# Patient Record
Sex: Female | Born: 1950 | Race: White | Hispanic: No | Smoking: Former smoker
Health system: Southern US, Community
[De-identification: ages and names within clinical notes are randomized; demographics above are authoritative.]

## PROBLEM LIST (undated history)

## (undated) DIAGNOSIS — F410 Panic disorder [episodic paroxysmal anxiety] without agoraphobia: Secondary | ICD-10-CM

## (undated) DIAGNOSIS — H409 Unspecified glaucoma: Secondary | ICD-10-CM

## (undated) DIAGNOSIS — E119 Type 2 diabetes mellitus without complications: Secondary | ICD-10-CM

## (undated) DIAGNOSIS — E785 Hyperlipidemia, unspecified: Secondary | ICD-10-CM

## (undated) DIAGNOSIS — C801 Malignant (primary) neoplasm, unspecified: Secondary | ICD-10-CM

## (undated) DIAGNOSIS — I10 Essential (primary) hypertension: Secondary | ICD-10-CM

## (undated) DIAGNOSIS — J189 Pneumonia, unspecified organism: Secondary | ICD-10-CM

## (undated) DIAGNOSIS — E039 Hypothyroidism, unspecified: Secondary | ICD-10-CM

## (undated) DIAGNOSIS — R0600 Dyspnea, unspecified: Secondary | ICD-10-CM

## (undated) HISTORY — PX: APPENDECTOMY: SHX54

## (undated) HISTORY — PX: RHINOPLASTY: SUR1284

## (undated) HISTORY — PX: EYE SURGERY: SHX253

## (undated) HISTORY — PX: DILATION AND CURETTAGE OF UTERUS: SHX78

## (undated) HISTORY — PX: ABDOMINAL HYSTERECTOMY: SHX81

## (undated) HISTORY — PX: CHOLECYSTECTOMY: SHX55

## (undated) HISTORY — PX: TONSILLECTOMY: SUR1361

---

## 2016-08-05 ENCOUNTER — Other Ambulatory Visit: Payer: Self-pay | Admitting: Family Medicine

## 2016-08-05 DIAGNOSIS — R921 Mammographic calcification found on diagnostic imaging of breast: Secondary | ICD-10-CM

## 2016-08-06 ENCOUNTER — Ambulatory Visit
Admission: RE | Admit: 2016-08-06 | Discharge: 2016-08-06 | Disposition: A | Discharge status: Home / Self Care | Payer: Medicare Other | Source: Ambulatory Visit | Attending: Family Medicine | Admitting: Family Medicine

## 2016-08-06 ENCOUNTER — Other Ambulatory Visit: Payer: Self-pay | Admitting: Family Medicine

## 2016-08-06 DIAGNOSIS — R921 Mammographic calcification found on diagnostic imaging of breast: Secondary | ICD-10-CM

## 2016-08-06 DIAGNOSIS — N6320 Unspecified lump in the left breast, unspecified quadrant: Secondary | ICD-10-CM

## 2016-08-16 ENCOUNTER — Other Ambulatory Visit: Payer: Self-pay | Admitting: Surgery

## 2016-08-16 DIAGNOSIS — N6320 Unspecified lump in the left breast, unspecified quadrant: Secondary | ICD-10-CM

## 2016-09-03 ENCOUNTER — Encounter (HOSPITAL_BASED_OUTPATIENT_CLINIC_OR_DEPARTMENT_OTHER): Payer: Self-pay | Admitting: *Deleted

## 2016-09-03 ENCOUNTER — Other Ambulatory Visit: Payer: Self-pay | Admitting: Surgery

## 2016-09-03 DIAGNOSIS — N6320 Unspecified lump in the left breast, unspecified quadrant: Secondary | ICD-10-CM

## 2016-09-07 ENCOUNTER — Encounter (HOSPITAL_BASED_OUTPATIENT_CLINIC_OR_DEPARTMENT_OTHER)
Admission: RE | Admit: 2016-09-07 | Discharge: 2016-09-07 | Disposition: A | Discharge status: Home / Self Care | Payer: Medicare Other | Source: Ambulatory Visit | Attending: Surgery | Admitting: Surgery

## 2016-09-07 DIAGNOSIS — N6320 Unspecified lump in the left breast, unspecified quadrant: Secondary | ICD-10-CM | POA: Diagnosis present

## 2016-09-07 DIAGNOSIS — I10 Essential (primary) hypertension: Secondary | ICD-10-CM | POA: Diagnosis not present

## 2016-09-07 DIAGNOSIS — Z79899 Other long term (current) drug therapy: Secondary | ICD-10-CM | POA: Diagnosis not present

## 2016-09-07 DIAGNOSIS — Z803 Family history of malignant neoplasm of breast: Secondary | ICD-10-CM | POA: Diagnosis not present

## 2016-09-07 DIAGNOSIS — Z7984 Long term (current) use of oral hypoglycemic drugs: Secondary | ICD-10-CM | POA: Diagnosis not present

## 2016-09-07 DIAGNOSIS — Z87891 Personal history of nicotine dependence: Secondary | ICD-10-CM | POA: Diagnosis not present

## 2016-09-07 DIAGNOSIS — Z6833 Body mass index (BMI) 33.0-33.9, adult: Secondary | ICD-10-CM | POA: Diagnosis not present

## 2016-09-07 DIAGNOSIS — D0512 Intraductal carcinoma in situ of left breast: Secondary | ICD-10-CM | POA: Diagnosis not present

## 2016-09-07 DIAGNOSIS — E119 Type 2 diabetes mellitus without complications: Secondary | ICD-10-CM | POA: Diagnosis not present

## 2016-09-07 LAB — BASIC METABOLIC PANEL
ANION GAP: 10 (ref 5–15)
BUN: 8 mg/dL (ref 6–20)
CALCIUM: 9.6 mg/dL (ref 8.9–10.3)
CO2: 23 mmol/L (ref 22–32)
CREATININE: 0.73 mg/dL (ref 0.44–1.00)
Chloride: 103 mmol/L (ref 101–111)
GFR calc Af Amer: >60 mL/min (ref >60)
GLUCOSE: 202 mg/dL — AB (ref 65–99)
Potassium: 4.4 mmol/L (ref 3.5–5.1)
Sodium: 136 mmol/L (ref 135–145)

## 2016-09-07 NOTE — Progress Notes (Signed)
Bottled water given with instructions to complete by [facility], pt verbalized understanding.

## 2016-09-08 ENCOUNTER — Ambulatory Visit
Admission: RE | Admit: 2016-09-08 | Discharge: 2016-09-08 | Disposition: A | Discharge status: Home / Self Care | Payer: Medicare Other | Source: Ambulatory Visit | Attending: Surgery | Admitting: Surgery

## 2016-09-08 DIAGNOSIS — N6320 Unspecified lump in the left breast, unspecified quadrant: Secondary | ICD-10-CM

## 2016-09-08 NOTE — H&P (Signed)
Holly [name] 08/16/2016 10:13 AM Location: [facility] Patient #: [id number] DOB: 1951/04/11 Divorced / Language: Cleophus Molt / Race: White Female   History of Present Illness (Yuvan Medinger A. Ninfa Linden MD; 08/16/2016 10:34 AM) Patient words: New-Breast CA.  The patient is a 66 year old female who presents with a breast mass. This is a pleasant female who is referred by the breast center. She had a recent screening mammography and 3 suspicious areas were seen in the breast. All 3 areas were biopsied and found to be benign fibrocystic changes and a fibroadenoma but one area in the lateral breast was felt to be discordant. A lumpectomy as been recommended. She has had no previous surgery regarding her breast. Her mother had breast cancer age 66. She denies nipple discharge. She is otherwise without complaints.   Past Surgical History Malachi Bonds, CMA; 08/16/2016 10:14 AM) Appendectomy  Breast Biopsy  Left. Cataract Surgery  Left. Cesarean Section - 1  Gallbladder Surgery - Open  Hysterectomy (not due to cancer) - Partial  Tonsillectomy   Diagnostic Studies History Malachi Bonds, CMA; 08/16/2016 10:14 AM) Colonoscopy  never Mammogram  within last year Pap Smear  >5 years ago  Allergies Malachi Bonds, CMA; 08/16/2016 10:15 AM) No Known Drug Allergies 08/16/2016  Medication History (Chemira Jones, CMA; 08/16/2016 10:16 AM) Dorzolamide HCl (2% Solution, Ophthalmic) Active. Latanoprost (0.005% Solution, Ophthalmic) Active. Timolol Maleate (0.5% Solution, Ophthalmic) Active. Simvastatin (10MG  Tablet, Oral) Active. MetFORMIN HCl (500MG  Tablet, Oral) Active. Levothyroxine Sodium (100MCG Tablet, Oral) Active. Lisinopril (10MG  Tablet, Oral) Active. Medications Reconciled  Social History Malachi Bonds, CMA; 08/16/2016 10:14 AM) Alcohol use  Occasional alcohol use. Caffeine use  Carbonated beverages, Coffee, Tea. No drug use  Tobacco use  Former  smoker.  Family History Malachi Bonds, CMA; 08/16/2016 10:14 AM) Arthritis  Mother. Breast Cancer  Mother. Cerebrovascular Accident  Mother, Sister. Heart Disease  Father. Respiratory Condition  Father, Mother, Sister. Thyroid problems  Mother, Sister.  Pregnancy / Birth History Malachi Bonds, CMA; 08/16/2016 10:14 AM) Age at menarche  24 years. Contraceptive History  Oral contraceptives. Gravida  2 Length (months) of breastfeeding  7-12 Maternal age  6-25 Para  2  Other Problems Malachi Bonds, CMA; 08/16/2016 10:14 AM) Cholelithiasis  Diabetes Mellitus  High blood pressure  Thyroid Disease     Review of Systems (Chemira Jones CMA; 08/16/2016 10:14 AM) General Not Present- Appetite Loss, Chills, Fatigue, Fever, Night Sweats, Weight Gain and Weight Loss. Skin Not Present- Change in Wart/Mole, Dryness, Hives, Jaundice, New Lesions, Non-Healing Wounds, Rash and Ulcer. HEENT Not Present- Earache, Hearing Loss, Hoarseness, Nose Bleed, Oral Ulcers, Ringing in the Ears, Seasonal Allergies, Sinus Pain, Sore Throat, Visual Disturbances, Wears glasses/contact lenses and Yellow Eyes. Breast Not Present- Breast Mass, Breast Pain, Nipple Discharge and Skin Changes. Cardiovascular Not Present- Chest Pain, Difficulty Breathing Lying Down, Leg Cramps, Palpitations, Rapid Heart Rate, Shortness of Breath and Swelling of Extremities. Gastrointestinal Not Present- Abdominal Pain, Bloating, Bloody Stool, Change in Bowel Habits, Chronic diarrhea, Constipation, Difficulty Swallowing, Excessive gas, Gets full quickly at meals, Hemorrhoids, Indigestion, Nausea, Rectal Pain and Vomiting. Female Genitourinary Not Present- Frequency, Nocturia, Painful Urination, Pelvic Pain and Urgency. Musculoskeletal Not Present- Back Pain, Joint Pain, Joint Stiffness, Muscle Pain, Muscle Weakness and Swelling of Extremities. Neurological Not Present- Decreased Memory, Fainting, Headaches, Numbness,  Seizures, Tingling, Tremor, Trouble walking and Weakness. Psychiatric Not Present- Anxiety, Bipolar, Change in Sleep Pattern, Depression, Fearful and Frequent crying. Endocrine Not Present- Cold Intolerance, Excessive Hunger, Hair Changes, Heat  Intolerance, Hot flashes and New Diabetes. Hematology Not Present- Blood Thinners, Easy Bruising, Excessive bleeding, Gland problems, HIV and Persistent Infections.  Vitals (Chemira Jones CMA; 08/16/2016 10:14 AM) 08/16/2016 10:14 AM Weight: 197.4 lb Height: 64in Body Surface Area: 1.95 m Body Mass Index: 33.88 kg/m  Temp.: 98.55F(Oral)  Pulse: 73 (Regular)  BP: 150/86 (Sitting, Left Arm, Standard)       Physical Exam (Geeta Dworkin A. Ninfa Linden MD; 08/16/2016 10:34 AM) General Mental Status-Alert. General Appearance-Consistent with stated age. Hydration-Well hydrated. Voice-Normal.  Head and Neck Head-normocephalic, atraumatic with no lesions or palpable masses. Trachea-midline. Thyroid Gland Characteristics - normal size and consistency.  Eye Eyeball - Bilateral-Extraocular movements intact. Sclera/Conjunctiva - Bilateral-No scleral icterus.  Chest and Lung Exam Chest and lung exam reveals -quiet, even and easy respiratory effort with no use of accessory muscles and on auscultation, normal breath sounds, no adventitious sounds and normal vocal resonance. Inspection Chest Wall - Normal. Back - normal.  Breast Breast - Left-Non Tender, No Biopsy scars, no Dimpling, No Inflammation, No Lumpectomy scars, No Mastectomy scars, No Peau d' Orange. Note: There is ecchymosis of the left breast. I can palpate several hematomas in the breast as well. There are no specific masses. The nipple areolar complex is normal. There is no adenopathy Breast - Right-Symmetric, Non Tender, No Biopsy scars, no Dimpling, No Inflammation, No Lumpectomy scars, No Mastectomy scars, No Peau d' Orange.  Cardiovascular Cardiovascular  examination reveals -normal heart sounds, regular rate and rhythm with no murmurs and normal pedal pulses bilaterally.  Abdomen Inspection Inspection of the abdomen reveals - No Hernias. Skin - Scar - no surgical scars. Palpation/Percussion Palpation and Percussion of the abdomen reveal - Soft, Non Tender, No Rebound tenderness, No Rigidity (guarding) and No hepatosplenomegaly. Auscultation Auscultation of the abdomen reveals - Bowel sounds normal.  Neurologic - Did not examine.  Musculoskeletal - Did not examine.  Lymphatic Head & Neck  General Head & Neck Lymphatics: Bilateral - Description - Normal. Axillary  General Axillary Region: Bilateral - Description - Normal. Tenderness - Non Tender. Femoral & Inguinal  Generalized Femoral & Inguinal Lymphatics: Bilateral - Description - Normal. Tenderness - Non Tender.    Assessment & Plan (Zykeem Bauserman A. Ninfa Linden MD; 08/16/2016 10:35 AM) LEFT BREAST MASS (N63.20) Impression: I have reviewed the mammograms, ultrasound, and pathology results. Again, one area in the left lateral breast is discordant. A radioactive seed localized left breast lumpectomy is recommended for histologic evaluation to rule out malignancy. I discussed procedure with her in detail. I discussed risks of surgery which includes but is not limited to bleeding, infection, need for further surgery malignancy is found, postoperative recovery, etc. She understands and wished to proceed with surgery which will be scheduled

## 2016-09-09 ENCOUNTER — Ambulatory Visit (HOSPITAL_BASED_OUTPATIENT_CLINIC_OR_DEPARTMENT_OTHER)
Admission: RE | Admit: 2016-09-09 | Discharge: 2016-09-09 | Disposition: A | Discharge status: Home / Self Care | Payer: Medicare Other | Source: Ambulatory Visit | Attending: Surgery | Admitting: Surgery

## 2016-09-09 ENCOUNTER — Ambulatory Visit (HOSPITAL_BASED_OUTPATIENT_CLINIC_OR_DEPARTMENT_OTHER): Payer: Medicare Other | Admitting: Anesthesiology

## 2016-09-09 ENCOUNTER — Ambulatory Visit
Admission: RE | Admit: 2016-09-09 | Discharge: 2016-09-09 | Disposition: A | Discharge status: Home / Self Care | Payer: Medicare Other | Source: Ambulatory Visit | Attending: Surgery | Admitting: Surgery

## 2016-09-09 ENCOUNTER — Encounter (HOSPITAL_BASED_OUTPATIENT_CLINIC_OR_DEPARTMENT_OTHER): Payer: Self-pay | Admitting: Anesthesiology

## 2016-09-09 ENCOUNTER — Encounter (HOSPITAL_BASED_OUTPATIENT_CLINIC_OR_DEPARTMENT_OTHER)
Admission: RE | Disposition: A | Discharge status: Home / Self Care | Payer: Self-pay | Source: Ambulatory Visit | Attending: Surgery

## 2016-09-09 DIAGNOSIS — Z79899 Other long term (current) drug therapy: Secondary | ICD-10-CM | POA: Diagnosis not present

## 2016-09-09 DIAGNOSIS — D0512 Intraductal carcinoma in situ of left breast: Secondary | ICD-10-CM | POA: Insufficient documentation

## 2016-09-09 DIAGNOSIS — Z7984 Long term (current) use of oral hypoglycemic drugs: Secondary | ICD-10-CM | POA: Insufficient documentation

## 2016-09-09 DIAGNOSIS — Z6833 Body mass index (BMI) 33.0-33.9, adult: Secondary | ICD-10-CM | POA: Insufficient documentation

## 2016-09-09 DIAGNOSIS — Z803 Family history of malignant neoplasm of breast: Secondary | ICD-10-CM | POA: Insufficient documentation

## 2016-09-09 DIAGNOSIS — N6320 Unspecified lump in the left breast, unspecified quadrant: Secondary | ICD-10-CM

## 2016-09-09 DIAGNOSIS — Z87891 Personal history of nicotine dependence: Secondary | ICD-10-CM | POA: Insufficient documentation

## 2016-09-09 DIAGNOSIS — I10 Essential (primary) hypertension: Secondary | ICD-10-CM | POA: Insufficient documentation

## 2016-09-09 DIAGNOSIS — E119 Type 2 diabetes mellitus without complications: Secondary | ICD-10-CM | POA: Insufficient documentation

## 2016-09-09 HISTORY — PX: BREAST LUMPECTOMY WITH RADIOACTIVE SEED LOCALIZATION: SHX6424

## 2016-09-09 HISTORY — DX: Essential (primary) hypertension: I10

## 2016-09-09 HISTORY — DX: Unspecified glaucoma: H40.9

## 2016-09-09 HISTORY — DX: Hyperlipidemia, unspecified: E78.5

## 2016-09-09 HISTORY — DX: Type 2 diabetes mellitus without complications: E11.9

## 2016-09-09 HISTORY — DX: Panic disorder (episodic paroxysmal anxiety): F41.0

## 2016-09-09 LAB — GLUCOSE, CAPILLARY
Glucose-Capillary: 212 mg/dL — ABNORMAL HIGH (ref 65–99)
Glucose-Capillary: 191 mg/dL — ABNORMAL HIGH (ref 65–99)

## 2016-09-09 SURGERY — BREAST LUMPECTOMY WITH RADIOACTIVE SEED LOCALIZATION
Anesthesia: General | Site: Breast | Laterality: Left

## 2016-09-09 MED ORDER — PHENYLEPHRINE 40 MCG/ML (10ML) SYRINGE FOR IV PUSH (FOR BLOOD PRESSURE SUPPORT)
PREFILLED_SYRINGE | INTRAVENOUS | Status: AC
  Filled 2016-09-09: qty 10

## 2016-09-09 MED ORDER — CHLORHEXIDINE GLUCONATE CLOTH 2 % EX PADS: 6.0000 | MEDICATED_PAD | Freq: Once | CUTANEOUS | Status: DC

## 2016-09-09 MED ORDER — DEXAMETHASONE SODIUM PHOSPHATE 10 MG/ML IJ SOLN
INTRAMUSCULAR | Status: AC
  Filled 2016-09-09: qty 1

## 2016-09-09 MED ORDER — SODIUM CHLORIDE 0.9% FLUSH: 3.0000 mL | INTRAVENOUS | Status: DC | PRN

## 2016-09-09 MED ORDER — SUCCINYLCHOLINE CHLORIDE 200 MG/10ML IV SOSY
PREFILLED_SYRINGE | INTRAVENOUS | Status: AC
  Filled 2016-09-09: qty 10

## 2016-09-09 MED ORDER — BUPIVACAINE-EPINEPHRINE 0.5% -1:200000 IJ SOLN
INTRAMUSCULAR | Status: DC | PRN
  Administered 2016-09-09: 12 mL

## 2016-09-09 MED ORDER — ACETAMINOPHEN 500 MG PO TABS
1000.0000 mg | ORAL_TABLET | ORAL | Status: AC
  Administered 2016-09-09: 1000 mg via ORAL

## 2016-09-09 MED ORDER — KETOROLAC TROMETHAMINE 15 MG/ML IJ SOLN
INTRAMUSCULAR | Status: DC | PRN
  Administered 2016-09-09: 15 mg via INTRAVENOUS

## 2016-09-09 MED ORDER — FENTANYL CITRATE (PF) 100 MCG/2ML IJ SOLN
50.0000 ug | INTRAMUSCULAR | Status: DC | PRN
  Administered 2016-09-09: 50 ug via INTRAVENOUS

## 2016-09-09 MED ORDER — LIDOCAINE 2% (20 MG/ML) 5 ML SYRINGE
INTRAMUSCULAR | Status: AC
  Filled 2016-09-09: qty 5

## 2016-09-09 MED ORDER — ACETAMINOPHEN 325 MG PO TABS: 650.0000 mg | ORAL_TABLET | ORAL | Status: DC | PRN

## 2016-09-09 MED ORDER — ONDANSETRON HCL 4 MG/2ML IJ SOLN
INTRAMUSCULAR | Status: DC | PRN
Start: 2016-09-09 — End: 2016-09-09
  Administered 2016-09-09: 4 mg via INTRAVENOUS

## 2016-09-09 MED ORDER — FENTANYL CITRATE (PF) 100 MCG/2ML IJ SOLN
INTRAMUSCULAR | Status: AC
  Filled 2016-09-09: qty 2

## 2016-09-09 MED ORDER — ONDANSETRON HCL 4 MG/2ML IJ SOLN
INTRAMUSCULAR | Status: AC
  Filled 2016-09-09: qty 2

## 2016-09-09 MED ORDER — SCOPOLAMINE 1 MG/3DAYS TD PT72: 1.0000 | MEDICATED_PATCH | Freq: Once | TRANSDERMAL | Status: DC | PRN

## 2016-09-09 MED ORDER — MIDAZOLAM HCL 2 MG/2ML IJ SOLN: 1.0000 mg | INTRAMUSCULAR | Status: DC | PRN

## 2016-09-09 MED ORDER — OXYCODONE HCL 5 MG PO TABS: 5.0000 mg | ORAL_TABLET | Freq: Once | ORAL | Status: DC | PRN

## 2016-09-09 MED ORDER — FENTANYL CITRATE (PF) 100 MCG/2ML IJ SOLN: 25.0000 ug | INTRAMUSCULAR | Status: DC | PRN

## 2016-09-09 MED ORDER — SODIUM CHLORIDE 0.9% FLUSH: 3.0000 mL | Freq: Two times a day (BID) | INTRAVENOUS | Status: DC

## 2016-09-09 MED ORDER — MIDAZOLAM HCL 2 MG/2ML IJ SOLN
INTRAMUSCULAR | Status: AC
  Filled 2016-09-09: qty 2

## 2016-09-09 MED ORDER — CEFAZOLIN SODIUM-DEXTROSE 2-4 GM/100ML-% IV SOLN
INTRAVENOUS | Status: AC
  Filled 2016-09-09: qty 100

## 2016-09-09 MED ORDER — KETOROLAC TROMETHAMINE 30 MG/ML IJ SOLN
INTRAMUSCULAR | Status: AC
  Filled 2016-09-09: qty 1

## 2016-09-09 MED ORDER — EPHEDRINE 5 MG/ML INJ
INTRAVENOUS | Status: AC
  Filled 2016-09-09: qty 10

## 2016-09-09 MED ORDER — OXYCODONE HCL 5 MG/5ML PO SOLN: 5.0000 mg | Freq: Once | ORAL | Status: DC | PRN

## 2016-09-09 MED ORDER — OXYCODONE HCL 5 MG PO TABS: 5.0000 mg | ORAL_TABLET | ORAL | Status: DC | PRN

## 2016-09-09 MED ORDER — LACTATED RINGERS IV SOLN
INTRAVENOUS | Status: DC
  Administered 2016-09-09: 09:00:00 via INTRAVENOUS

## 2016-09-09 MED ORDER — BUPIVACAINE-EPINEPHRINE (PF) 0.5% -1:200000 IJ SOLN
INTRAMUSCULAR | Status: AC
  Filled 2016-09-09: qty 30

## 2016-09-09 MED ORDER — SODIUM CHLORIDE 0.9 % IV SOLN: 250.0000 mL | INTRAVENOUS | Status: DC | PRN

## 2016-09-09 MED ORDER — ACETAMINOPHEN 500 MG PO TABS
ORAL_TABLET | ORAL | Status: AC
  Filled 2016-09-09: qty 2

## 2016-09-09 MED ORDER — LIDOCAINE HCL (CARDIAC) 20 MG/ML IV SOLN
INTRAVENOUS | Status: DC | PRN
  Administered 2016-09-09: 60 mg via INTRAVENOUS

## 2016-09-09 MED ORDER — PROPOFOL 10 MG/ML IV BOLUS
INTRAVENOUS | Status: DC | PRN
  Administered 2016-09-09: 150 mg via INTRAVENOUS

## 2016-09-09 MED ORDER — TRAMADOL HCL 50 MG PO TABS: 50.0000 mg | ORAL_TABLET | Freq: Four times a day (QID) | ORAL | 0 refills | Status: DC | PRN

## 2016-09-09 MED ORDER — MORPHINE SULFATE (PF) 2 MG/ML IV SOLN: 1.0000 mg | INTRAVENOUS | Status: DC | PRN

## 2016-09-09 MED ORDER — CEFAZOLIN SODIUM-DEXTROSE 2-4 GM/100ML-% IV SOLN
2.0000 g | INTRAVENOUS | Status: AC
  Administered 2016-09-09: 2 g via INTRAVENOUS

## 2016-09-09 MED ORDER — DEXAMETHASONE SODIUM PHOSPHATE 4 MG/ML IJ SOLN
INTRAMUSCULAR | Status: DC | PRN
  Administered 2016-09-09: 8 mg via INTRAVENOUS

## 2016-09-09 MED ORDER — ACETAMINOPHEN 650 MG RE SUPP: 650.0000 mg | RECTAL | Status: DC | PRN

## 2016-09-09 SURGICAL SUPPLY — 46 items
APPLIER CLIP 9.375 MED OPEN (MISCELLANEOUS) ×3
BLADE HEX COATED 2.75 (ELECTRODE) ×3 IMPLANT
BLADE SURG 15 STRL LF DISP TIS (BLADE) ×1 IMPLANT
BLADE SURG 15 STRL SS (BLADE) ×2
CANISTER SUC SOCK COL 7IN (MISCELLANEOUS) IMPLANT
CANISTER SUCT 1200ML W/VALVE (MISCELLANEOUS) IMPLANT
CHLORAPREP W/TINT 26ML (MISCELLANEOUS) ×3 IMPLANT
CLIP APPLIE 9.375 MED OPEN (MISCELLANEOUS) ×1 IMPLANT
CLIP TI WIDE RED SMALL 6 (CLIP) IMPLANT
COVER BACK TABLE 60X90IN (DRAPES) ×3 IMPLANT
COVER MAYO STAND STRL (DRAPES) ×3 IMPLANT
COVER PROBE W GEL 5X96 (DRAPES) ×3 IMPLANT
DECANTER SPIKE VIAL GLASS SM (MISCELLANEOUS) IMPLANT
DERMABOND ADVANCED (GAUZE/BANDAGES/DRESSINGS) ×2
DERMABOND ADVANCED .7 DNX12 (GAUZE/BANDAGES/DRESSINGS) ×1 IMPLANT
DEVICE DUBIN W/COMP PLATE 8390 (MISCELLANEOUS) ×3 IMPLANT
DRAPE LAPAROSCOPIC ABDOMINAL (DRAPES) ×3 IMPLANT
DRAPE UTILITY XL STRL (DRAPES) ×3 IMPLANT
ELECT REM PT RETURN 9FT ADLT (ELECTROSURGICAL) ×3
ELECTRODE REM PT RTRN 9FT ADLT (ELECTROSURGICAL) ×1 IMPLANT
GAUZE SPONGE 4X4 12PLY STRL LF (GAUZE/BANDAGES/DRESSINGS) IMPLANT
GLOVE BIO SURGEON STRL SZ7 (GLOVE) ×3 IMPLANT
GLOVE BIOGEL PI IND STRL 7.5 (GLOVE) ×1 IMPLANT
GLOVE BIOGEL PI INDICATOR 7.5 (GLOVE) ×2
GLOVE SURG SIGNA 7.5 PF LTX (GLOVE) ×3 IMPLANT
GOWN STRL REUS W/ TWL LRG LVL3 (GOWN DISPOSABLE) ×1 IMPLANT
GOWN STRL REUS W/ TWL XL LVL3 (GOWN DISPOSABLE) ×1 IMPLANT
GOWN STRL REUS W/TWL LRG LVL3 (GOWN DISPOSABLE) ×2
GOWN STRL REUS W/TWL XL LVL3 (GOWN DISPOSABLE) ×2
KIT MARKER MARGIN INK (KITS) ×3 IMPLANT
NEEDLE HYPO 25X1 1.5 SAFETY (NEEDLE) ×3 IMPLANT
NS IRRIG 1000ML POUR BTL (IV SOLUTION) IMPLANT
PACK BASIN DAY SURGERY FS (CUSTOM PROCEDURE TRAY) ×3 IMPLANT
PENCIL BUTTON HOLSTER BLD 10FT (ELECTRODE) ×3 IMPLANT
SLEEVE SCD COMPRESS KNEE MED (MISCELLANEOUS) ×3 IMPLANT
SPONGE LAP 4X18 X RAY DECT (DISPOSABLE) ×3 IMPLANT
SUT MNCRL AB 4-0 PS2 18 (SUTURE) ×3 IMPLANT
SUT SILK 2 0 SH (SUTURE) IMPLANT
SUT VIC AB 3-0 SH 27 (SUTURE) ×2
SUT VIC AB 3-0 SH 27X BRD (SUTURE) ×1 IMPLANT
SYR CONTROL 10ML LL (SYRINGE) ×3 IMPLANT
TOWEL OR 17X24 6PK STRL BLUE (TOWEL DISPOSABLE) ×3 IMPLANT
TOWEL OR NON WOVEN STRL DISP B (DISPOSABLE) ×3 IMPLANT
TUBE CONNECTING 20'X1/4 (TUBING)
TUBE CONNECTING 20X1/4 (TUBING) IMPLANT
YANKAUER SUCT BULB TIP NO VENT (SUCTIONS) IMPLANT

## 2016-09-09 NOTE — Transfer of Care (Signed)
Immediate Anesthesia Transfer of Care Note  Patient: Libby Maw  Procedure(s) Performed: Procedure(s): LEFT BREAST LUMPECTOMY WITH RADIOACTIVE SEED LOCALIZATION (Left)  Patient Location: PACU  Anesthesia Type:General  Level of Consciousness: awake, alert  and drowsy  Airway & Oxygen Therapy: Patient Spontanous Breathing and Patient connected to face mask oxygen  Post-op Assessment: Report given to RN and Post -op Vital signs reviewed and stable  Post vital signs: Reviewed and stable  Last Vitals:  Vitals:   09/09/16 0910  BP: 127/72  Pulse: 77  Resp: 18  Temp: 36.6 C    Last Pain:  Vitals:   09/09/16 0910  TempSrc: Oral      Patients Stated Pain Goal: 0 ([id number])  Complications: No apparent anesthesia complications

## 2016-09-09 NOTE — Anesthesia Preprocedure Evaluation (Signed)
Anesthesia Evaluation  Patient identified by MRN, date of birth, ID band Patient awake    Reviewed: Allergy & Precautions, NPO status , Patient's Chart, lab work & pertinent test results  History of Anesthesia Complications Negative for: history of anesthetic complications  Airway Mallampati: II  TM Distance: >3 FB Neck ROM: Full    Dental  (+) Teeth Intact,    Pulmonary neg shortness of breath, neg sleep apnea, neg COPD, neg recent URI, former smoker,    breath sounds clear to auscultation       Cardiovascular hypertension, Pt. on medications (-) angina(-) Past MI and (-) CHF  Rhythm:Regular     Neuro/Psych PSYCHIATRIC DISORDERS Anxiety negative neurological ROS     GI/Hepatic negative GI ROS, Neg liver ROS,   Endo/Other  diabetes, Type 2, Oral Hypoglycemic AgentsMorbid obesity  Renal/GU negative Renal ROS     Musculoskeletal   Abdominal   Peds  Hematology   Anesthesia Other Findings   Reproductive/Obstetrics                             Anesthesia Physical Anesthesia Plan  ASA: III  Anesthesia Plan: General   Post-op Pain Management:    Induction: Intravenous  Airway Management Planned: LMA  Additional Equipment: None  Intra-op Plan:   Post-operative Plan: Extubation in OR  Informed Consent: I have reviewed the patients History and Physical, chart, labs and discussed the procedure including the risks, benefits and alternatives for the proposed anesthesia with the patient or authorized representative who has indicated his/her understanding and acceptance.   Dental advisory given  Plan Discussed with: CRNA and Surgeon  Anesthesia Plan Comments:         Anesthesia Quick Evaluation

## 2016-09-09 NOTE — Op Note (Signed)
LEFT BREAST LUMPECTOMY WITH RADIOACTIVE SEED LOCALIZATION  Procedure Note  Holly [name] 09/09/2016   Pre-op Diagnosis: left breast mass     Post-op Diagnosis: same  Procedure(s): LEFT BREAST LUMPECTOMY WITH RADIOACTIVE SEED LOCALIZATION  Surgeon(s): Coralie Keens, MD  Anesthesia: General  Staff:  Circulator: Ted Mcalpine, RN; Cliffton Asters, RN Scrub Person: Maurene Capes, RN  Estimated Blood Loss: Minimal               Specimens: sent to path  Indications: This is [name] 66 year old female who is status post stereotactic guided biopsy of 3 areas in her left breast. All 3 biopsies were benign but one area was felt to be discordant with the mammographic findings therefore [name] radioactive seed localized left breast lumpectomy of this area was recommended.  Findings: There was [name] palpable mass in the specimen. Under x-ray, the radioactive seed and previous marker were in the center of the specimen which was then sent to pathology for evaluation  Procedure: The patient was brought to the operating room and identified as the correct patient. She was placed supine on the operating table and general anesthesia was induced. Her left breast was then prepped and draped in usual sterile fashion. The cecum was located with the neoprobe in the lower outer quadrant. I anesthetized the skin along the inframammary ridge and made an incision with the scalpel. I then tunneled up to the radiated seed with the aid of neoprobe and the electrocautery. I couldn't 5 palpable mass which is at the area of the seed. I performed [name] wide lumpectomy with electrocautery staying widely around the radioactive seed and mass. Once the mass was removed and marked on margins marker pain. I confirmed with the seed was in the specimen with the neoprobe and then the specimen was x-rayed again confirming that the seed improve his marker were in place. The specimen was sent to pathology for evaluation. I achieved  hemostasis with the cautery. I anesthetized wound further with Marcaine. I placed surgical clips into the biopsy cavity. I then closed the subcutaneous tissue with interrupted 3-0 Vicryl sutures and closed the skin with [name] running 4-0 Monocryl. Dermabond was then applied. The patient tolerated the procedure well. All the counts were correct at the end of the procedure. She was then extubated in the operating room and taken in [name] stable condition to the recovery room.          Holly [name]   Date: 09/09/2016  Time: 10:37 AM

## 2016-09-09 NOTE — Discharge Instructions (Signed)
[facility] °Office Phone Number [phone] ° °BREAST BIOPSY/ PARTIAL MASTECTOMY: POST OP INSTRUCTIONS ° °Always review your discharge instruction sheet given to you by the facility where your surgery was performed. ° °IF YOU HAVE DISABILITY OR FAMILY LEAVE FORMS, YOU MUST BRING THEM TO THE OFFICE FOR PROCESSING.  DO NOT GIVE THEM TO YOUR DOCTOR. ° °1. A prescription for pain medication may be given to you upon discharge.  Take your pain medication as prescribed, if needed.  If narcotic pain medicine is not needed, then you may take acetaminophen (Tylenol) or ibuprofen (Advil) as needed. °2. Take your usually prescribed medications unless otherwise directed °3. If you need a refill on your pain medication, please contact your pharmacy.  They will contact our office to request authorization.  Prescriptions will not be filled after 5pm or on week-ends. °4. You should eat very light the first 24 hours after surgery, such as soup, crackers, pudding, etc.  Resume your normal diet the day after surgery. °5. Most patients will experience some swelling and bruising in the breast.  Ice packs and a good support bra will help.  Swelling and bruising can take several days to resolve.  °6. It is common to experience some constipation if taking pain medication after surgery.  Increasing fluid intake and taking a stool softener will usually help or prevent this problem from occurring.  A mild laxative (Milk of Magnesia or Miralax) should be taken according to package directions if there are no bowel movements after 48 hours. °7. Unless discharge instructions indicate otherwise, you may remove your bandages 24-48 hours after surgery, and you may shower at that time.  You may have steri-strips (small skin tapes) in place directly over the incision.  These strips should be left on the skin for 7-10 days.  If your surgeon used skin glue on the incision, you may shower in 24 hours.  The glue will flake off over the  next 2-3 weeks.  Any sutures or staples will be removed at the office during your follow-up visit. °8. ACTIVITIES:  You may resume regular daily activities (gradually increasing) beginning the next day.  Wearing a good support bra or sports bra minimizes pain and swelling.  You may have sexual intercourse when it is comfortable. °a. You may drive when you no longer are taking prescription pain medication, you can comfortably wear a seatbelt, and you can safely maneuver your car and apply brakes. °b. RETURN TO WORK:  ______________________________________________________________________________________ °9. You should see your doctor in the office for a follow-up appointment approximately two weeks after your surgery.  Your doctor’s nurse will typically make your follow-up appointment when she calls you with your pathology report.  Expect your pathology report 2-3 business days after your surgery.  You may call to check if you do not hear from us after three days. °10. OTHER INSTRUCTIONS: _______________________________________________________________________________________________ _____________________________________________________________________________________________________________________________________ °_____________________________________________________________________________________________________________________________________ °_____________________________________________________________________________________________________________________________________ ° °WHEN TO CALL YOUR DOCTOR: °1. Fever over 101.0 °2. Nausea and/or vomiting. °3. Extreme swelling or bruising. °4. Continued bleeding from incision. °5. Increased pain, redness, or drainage from the incision. ° °The clinic staff is available to answer your questions during regular business hours.  Please don’t hesitate to call and ask to speak to one of the nurses for clinical concerns.  If you have a medical emergency, go to the nearest  emergency room or call 911.  A surgeon from [facility] is always on call at the hospital. ° °For further questions, please visit [email or web address]  ° ° ° ° °  Post Anesthesia Home Care Instructions ° °Activity: °Get plenty of rest for the remainder of the day. A responsible individual must stay with you for 24 hours following the procedure.  °For the next 24 hours, DO NOT: °-Drive a car °-Operate machinery °-Drink alcoholic beverages °-Take any medication unless instructed by your physician °-Make any legal decisions or sign important papers. ° °Meals: °Start with liquid foods such as gelatin or soup. Progress to regular foods as tolerated. Avoid greasy, spicy, heavy foods. If nausea and/or vomiting occur, drink only clear liquids until the nausea and/or vomiting subsides. Call your physician if vomiting continues. ° °Special Instructions/Symptoms: °Your throat may feel dry or sore from the anesthesia or the breathing tube placed in your throat during surgery. If this causes discomfort, gargle with warm salt water. The discomfort should disappear within 24 hours. ° °If you had a scopolamine patch placed behind your ear for the management of post- operative nausea and/or vomiting: ° °1. The medication in the patch is effective for 72 hours, after which it should be removed.  Wrap patch in a tissue and discard in the trash. Wash hands thoroughly with soap and water. °2. You may remove the patch earlier than 72 hours if you experience unpleasant side effects which may include dry mouth, dizziness or visual disturbances. °3. Avoid touching the patch. Wash your hands with soap and water after contact with the patch. °  ° °

## 2016-09-09 NOTE — Anesthesia Procedure Notes (Signed)
Procedure Name: Intubation Date/Time: 09/09/2016 10:07 AM Performed by: Melynda Ripple D Pre-anesthesia Checklist: Patient identified, Emergency Drugs available, Suction available and Patient being monitored Patient Re-evaluated:Patient Re-evaluated prior to inductionOxygen Delivery Method: Circle system utilized Preoxygenation: Pre-oxygenation with 100% oxygen Intubation Type: IV induction Ventilation: Mask ventilation without difficulty Tube type: Oral Number of attempts: 1 Airway Equipment and Method: Stylet and Oral airway Placement Confirmation: ETT inserted through vocal cords under direct vision,  positive ETCO2 and breath sounds checked- equal and bilateral Tube secured with: Tape Dental Injury: Teeth and Oropharynx as per pre-operative assessment

## 2016-09-09 NOTE — Interval H&P Note (Signed)
History and Physical Interval Note: no change in H and P  09/09/2016 9:41 AM  Holly [name]  has presented today for surgery, with the diagnosis of left breast mass  The various methods of treatment have been discussed with the patient and family. After consideration of risks, benefits and other options for treatment, the patient has consented to  Procedure(s): LEFT BREAST LUMPECTOMY WITH RADIOACTIVE SEED LOCALIZATION (Left) as a surgical intervention .  The patient's history has been reviewed, patient examined, no change in status, stable for surgery.  I have reviewed the patient's chart and labs.  Questions were answered to the patient's satisfaction.     Zakhi Dupre A

## 2016-09-09 NOTE — Anesthesia Postprocedure Evaluation (Signed)
Anesthesia Post Note  Patient: Holly [name]  Procedure(s) Performed: Procedure(s) (LRB): LEFT BREAST LUMPECTOMY WITH RADIOACTIVE SEED LOCALIZATION (Left)  Patient location during evaluation: PACU Anesthesia Type: General Level of consciousness: awake and alert Pain management: pain level controlled Vital Signs Assessment: post-procedure vital signs reviewed and stable Respiratory status: spontaneous breathing, nonlabored ventilation, respiratory function stable and patient connected to nasal cannula oxygen Cardiovascular status: blood pressure returned to baseline and stable Postop Assessment: no signs of nausea or vomiting Anesthetic complications: no       Last Vitals:  Vitals:   09/09/16 1130 09/09/16 1145  BP: (!) 98/46 123/71  Pulse: 73 78  Resp: 14 16  Temp:  36.7 C    Last Pain:  Vitals:   09/09/16 1145  TempSrc:   PainSc: 0-No pain                 Tiajuana Amass

## 2016-09-10 ENCOUNTER — Encounter (HOSPITAL_BASED_OUTPATIENT_CLINIC_OR_DEPARTMENT_OTHER): Payer: Self-pay | Admitting: Surgery

## 2016-09-13 ENCOUNTER — Other Ambulatory Visit: Payer: Self-pay | Admitting: Surgery

## 2016-10-04 ENCOUNTER — Encounter (HOSPITAL_COMMUNITY): Payer: Self-pay | Admitting: *Deleted

## 2016-10-04 NOTE — Progress Notes (Signed)
Pt stated that she gets SOB with exertion but denies chest pain and being under the care of a cardiologist. Pt denies having a stress test echo and cardiac cath. Pt stated that labs were drawn at PCP's office on 09/28/16; records requested. Pt made aware to stop taking Aspirin, vitamins, fish oil and herbal medications. Do not take any NSAIDs ie: Ibuprofen, Advil, Naproxen,BC and Goody Powder or any medication containing Aspirin. Pt stated that she does not have a glucometer to check BG. Pt made aware to not take Metformin on DOS. Pt verbalized understanding of all pre-op instructions.

## 2016-10-04 NOTE — H&P (Signed)
Holly [name] is an 66 y.o. female.   Chief Complaint: left breast DCIS HPI: This patient is status post a left breast lumpectomy with the findings of DCIS.  All the margins were negative but the anterior margin was close.  The decision has been made to re-excise the anterior margin.  She has no complaints  Past Medical History:  Diagnosis Date  . 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 Diabetes mellitus without complication ([place])   . Dyspnea    with exertion   . Glaucoma   . Hyperlipidemia   . Hypertension   . Hypothyroidism   . Panic attack   . Pneumonia     Past Surgical History:  Procedure Laterality Date  . ABDOMINAL HYSTERECTOMY    . APPENDECTOMY    . BREAST LUMPECTOMY WITH RADIOACTIVE SEED LOCALIZATION Left 09/09/2016   Procedure: LEFT BREAST LUMPECTOMY WITH RADIOACTIVE SEED LOCALIZATION;  Surgeon: Coralie Keens, MD;  Location: [facility];  Service: General;  Laterality: Left;  . CHOLECYSTECTOMY    . DILATION AND CURETTAGE OF UTERUS    . EYE SURGERY    . RHINOPLASTY    . TONSILLECTOMY      Family History  Problem Relation Age of Onset  . Cancer Mother   . Lung disease Father   . Heart attack Father    Social History:  reports that she has quit smoking. Her smoking use included Cigarettes. She has never used smokeless tobacco. She reports that she does not drink alcohol or use drugs.  Allergies:  Allergies  Allergen Reactions  . Brimonidine Itching    Timolol eye drop   . Chlorhexidine Gluconate Rash    Rash and itching from wipes    No prescriptions prior to admission.    No results found for this or any previous visit (from the past 48 hour(s)). No results found.  Review of Systems  All other systems reviewed and are negative.   There were no vitals taken for this visit. Physical Exam  Constitutional: She is oriented to person, place, and time. She appears well-developed and well-nourished. No  distress.  HENT:  Head: Normocephalic and atraumatic.  Right Ear: External ear normal.  Left Ear: External ear normal.  Nose: Nose normal.  Mouth/Throat: No oropharyngeal exudate.  Eyes: Pupils are equal, round, and reactive to light. Right eye exhibits no discharge. Left eye exhibits no discharge.  Neck: Normal range of motion. Neck supple.  Cardiovascular: Normal rate, regular rhythm and normal heart sounds.   Respiratory: Effort normal and breath sounds normal. No respiratory distress.  GI: Soft. Bowel sounds are normal. There is no tenderness.  Musculoskeletal: Normal range of motion.  Neurological: She is alert and oriented to person, place, and time.  Skin: Skin is warm and dry.  Psychiatric: Her behavior is normal. Judgment normal.   left breast incision is healing well.  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  After a discussion with the patient she agrees to proceed back to the ER for re-excision of the left breast DCIS close anterior margin. I discussed the risks of the procedure with her including the need for further surgery.  She agrees to proceed.  Harl Bowie, MD 10/04/2016, 6:55 PM

## 2016-10-05 ENCOUNTER — Ambulatory Visit (HOSPITAL_COMMUNITY): Payer: Medicare Other | Admitting: Anesthesiology

## 2016-10-05 ENCOUNTER — Ambulatory Visit (HOSPITAL_COMMUNITY)
Admission: RE | Admit: 2016-10-05 | Discharge: 2016-10-05 | Disposition: A | Discharge status: Home / Self Care | Payer: Medicare Other | Source: Ambulatory Visit | Attending: Surgery | Admitting: Surgery

## 2016-10-05 ENCOUNTER — Encounter (HOSPITAL_COMMUNITY)
Admission: RE | Disposition: A | Discharge status: Home / Self Care | Payer: Self-pay | Source: Ambulatory Visit | Attending: Surgery

## 2016-10-05 DIAGNOSIS — I10 Essential (primary) hypertension: Secondary | ICD-10-CM | POA: Insufficient documentation

## 2016-10-05 DIAGNOSIS — E119 Type 2 diabetes mellitus without complications: Secondary | ICD-10-CM | POA: Insufficient documentation

## 2016-10-05 DIAGNOSIS — E785 Hyperlipidemia, unspecified: Secondary | ICD-10-CM | POA: Diagnosis not present

## 2016-10-05 DIAGNOSIS — F410 Panic disorder [episodic paroxysmal anxiety] without agoraphobia: Secondary | ICD-10-CM | POA: Insufficient documentation

## 2016-10-05 DIAGNOSIS — Z7984 Long term (current) use of oral hypoglycemic drugs: Secondary | ICD-10-CM | POA: Diagnosis not present

## 2016-10-05 DIAGNOSIS — D0512 Intraductal carcinoma in situ of left breast: Secondary | ICD-10-CM | POA: Diagnosis not present

## 2016-10-05 DIAGNOSIS — E039 Hypothyroidism, unspecified: Secondary | ICD-10-CM | POA: Diagnosis not present

## 2016-10-05 DIAGNOSIS — Z87891 Personal history of nicotine dependence: Secondary | ICD-10-CM | POA: Diagnosis not present

## 2016-10-05 DIAGNOSIS — Z79899 Other long term (current) drug therapy: Secondary | ICD-10-CM | POA: Insufficient documentation

## 2016-10-05 DIAGNOSIS — H409 Unspecified glaucoma: Secondary | ICD-10-CM | POA: Diagnosis not present

## 2016-10-05 HISTORY — DX: Dyspnea, unspecified: R06.00

## 2016-10-05 HISTORY — DX: Pneumonia, unspecified organism: J18.9

## 2016-10-05 HISTORY — DX: Malignant (primary) neoplasm, unspecified: C80.1

## 2016-10-05 HISTORY — PX: RE-EXCISION OF BREAST CANCER,SUPERIOR MARGINS: SHX6047

## 2016-10-05 HISTORY — DX: Hypothyroidism, unspecified: E03.9

## 2016-10-05 LAB — GLUCOSE, CAPILLARY: Glucose-Capillary: 186 mg/dL — ABNORMAL HIGH (ref 65–99)

## 2016-10-05 LAB — BASIC METABOLIC PANEL
Anion gap: 12 (ref 5–15)
BUN: 11 mg/dL (ref 6–20)
CALCIUM: 9.5 mg/dL (ref 8.9–10.3)
CO2: 22 mmol/L (ref 22–32)
CREATININE: 0.75 mg/dL (ref 0.44–1.00)
Chloride: 102 mmol/L (ref 101–111)
GFR calc Af Amer: >60 mL/min (ref >60)
GFR calc non Af Amer: >60 mL/min (ref >60)
Glucose, Bld: 202 mg/dL — ABNORMAL HIGH (ref 65–99)
Potassium: 4 mmol/L (ref 3.5–5.1)
SODIUM: 136 mmol/L (ref 135–145)

## 2016-10-05 SURGERY — RE-EXCISION OF BREAST CANCER,SUPERIOR MARGINS
Anesthesia: General | Site: Breast | Laterality: Left

## 2016-10-05 MED ORDER — FENTANYL CITRATE (PF) 100 MCG/2ML IJ SOLN
INTRAMUSCULAR | Status: DC | PRN
  Administered 2016-10-05: 100 ug via INTRAVENOUS

## 2016-10-05 MED ORDER — PHENYLEPHRINE 40 MCG/ML (10ML) SYRINGE FOR IV PUSH (FOR BLOOD PRESSURE SUPPORT)
PREFILLED_SYRINGE | INTRAVENOUS | Status: AC
Start: 2016-10-05 — End: 2016-10-05
  Filled 2016-10-05: qty 10

## 2016-10-05 MED ORDER — EPHEDRINE 5 MG/ML INJ
INTRAVENOUS | Status: AC
  Filled 2016-10-05: qty 10

## 2016-10-05 MED ORDER — LIDOCAINE 2% (20 MG/ML) 5 ML SYRINGE
INTRAMUSCULAR | Status: AC
  Filled 2016-10-05: qty 5

## 2016-10-05 MED ORDER — PROPOFOL 10 MG/ML IV BOLUS
INTRAVENOUS | Status: DC | PRN
  Administered 2016-10-05: 30 mg via INTRAVENOUS
  Administered 2016-10-05: 130 mg via INTRAVENOUS

## 2016-10-05 MED ORDER — MIDAZOLAM HCL 2 MG/2ML IJ SOLN
INTRAMUSCULAR | Status: AC
Start: 2016-10-05 — End: 2016-10-05
  Filled 2016-10-05: qty 2

## 2016-10-05 MED ORDER — CEFAZOLIN SODIUM-DEXTROSE 2-4 GM/100ML-% IV SOLN
2.0000 g | INTRAVENOUS | Status: AC
  Administered 2016-10-05 (×2): 2 g via INTRAVENOUS
  Filled 2016-10-05: qty 100

## 2016-10-05 MED ORDER — BUPIVACAINE-EPINEPHRINE 0.25% -1:200000 IJ SOLN
INTRAMUSCULAR | Status: DC | PRN
  Administered 2016-10-05: 15 mL

## 2016-10-05 MED ORDER — 0.9 % SODIUM CHLORIDE (POUR BTL) OPTIME
TOPICAL | Status: DC | PRN
  Administered 2016-10-05: 1000 mL

## 2016-10-05 MED ORDER — BUPIVACAINE HCL (PF) 0.25 % IJ SOLN
INTRAMUSCULAR | Status: AC
  Filled 2016-10-05: qty 30

## 2016-10-05 MED ORDER — FENTANYL CITRATE (PF) 250 MCG/5ML IJ SOLN
INTRAMUSCULAR | Status: AC
  Filled 2016-10-05: qty 5

## 2016-10-05 MED ORDER — ONDANSETRON HCL 4 MG/2ML IJ SOLN
INTRAMUSCULAR | Status: AC
Start: 2016-10-05 — End: 2016-10-05
  Filled 2016-10-05: qty 2

## 2016-10-05 MED ORDER — MIDAZOLAM HCL 5 MG/5ML IJ SOLN
INTRAMUSCULAR | Status: DC | PRN
  Administered 2016-10-05: 2 mg via INTRAVENOUS

## 2016-10-05 MED ORDER — LACTATED RINGERS IV SOLN
INTRAVENOUS | Status: DC | PRN
  Administered 2016-10-05: 07:00:00 via INTRAVENOUS

## 2016-10-05 MED ORDER — ONDANSETRON HCL 4 MG/2ML IJ SOLN
INTRAMUSCULAR | Status: DC | PRN
  Administered 2016-10-05: 4 mg via INTRAVENOUS

## 2016-10-05 MED ORDER — SUCCINYLCHOLINE CHLORIDE 200 MG/10ML IV SOSY
PREFILLED_SYRINGE | INTRAVENOUS | Status: AC
  Filled 2016-10-05: qty 10

## 2016-10-05 MED ORDER — TRAMADOL HCL 50 MG PO TABS: 50.0000 mg | ORAL_TABLET | Freq: Four times a day (QID) | ORAL | 0 refills | Status: DC | PRN

## 2016-10-05 MED ORDER — PROPOFOL 10 MG/ML IV BOLUS
INTRAVENOUS | Status: AC
  Filled 2016-10-05: qty 20

## 2016-10-05 SURGICAL SUPPLY — 38 items
CANISTER SUCT 3000ML PPV (MISCELLANEOUS) IMPLANT
CHLORAPREP W/TINT 26ML (MISCELLANEOUS) ×3 IMPLANT
CONT SPEC 4OZ CLIKSEAL STRL BL (MISCELLANEOUS) ×3 IMPLANT
COVER SURGICAL LIGHT HANDLE (MISCELLANEOUS) ×3 IMPLANT
DECANTER SPIKE VIAL GLASS SM (MISCELLANEOUS) ×3 IMPLANT
DERMABOND ADVANCED (GAUZE/BANDAGES/DRESSINGS) ×2
DERMABOND ADVANCED .7 DNX12 (GAUZE/BANDAGES/DRESSINGS) ×1 IMPLANT
DRAPE CHEST BREAST 15X10 FENES (DRAPES) ×3 IMPLANT
DRAPE UTILITY XL STRL (DRAPES) ×6 IMPLANT
DRSG TEGADERM 4X4.75 (GAUZE/BANDAGES/DRESSINGS) ×3 IMPLANT
ELECT CAUTERY BLADE 6.4 (BLADE) ×3 IMPLANT
ELECT REM PT RETURN 9FT ADLT (ELECTROSURGICAL) ×3
ELECTRODE REM PT RTRN 9FT ADLT (ELECTROSURGICAL) ×1 IMPLANT
GAUZE SPONGE 4X4 12PLY STRL (GAUZE/BANDAGES/DRESSINGS) ×3 IMPLANT
GLOVE SURG SIGNA 7.5 PF LTX (GLOVE) ×3 IMPLANT
GLOVE SURG SS PI 7.0 STRL IVOR (GLOVE) ×6 IMPLANT
GOWN STRL REUS W/ TWL LRG LVL3 (GOWN DISPOSABLE) ×1 IMPLANT
GOWN STRL REUS W/ TWL XL LVL3 (GOWN DISPOSABLE) ×1 IMPLANT
GOWN STRL REUS W/TWL LRG LVL3 (GOWN DISPOSABLE) ×2
GOWN STRL REUS W/TWL XL LVL3 (GOWN DISPOSABLE) ×2
KIT BASIN OR (CUSTOM PROCEDURE TRAY) ×3 IMPLANT
KIT ROOM TURNOVER OR (KITS) ×3 IMPLANT
NEEDLE HYPO 25GX1X1/2 BEV (NEEDLE) ×3 IMPLANT
NS IRRIG 1000ML POUR BTL (IV SOLUTION) ×3 IMPLANT
PACK SURGICAL SETUP 50X90 (CUSTOM PROCEDURE TRAY) ×3 IMPLANT
PAD ARMBOARD 7.5X6 YLW CONV (MISCELLANEOUS) ×3 IMPLANT
PENCIL BUTTON HOLSTER BLD 10FT (ELECTRODE) ×3 IMPLANT
SPONGE LAP 4X18 X RAY DECT (DISPOSABLE) ×3 IMPLANT
SUT MNCRL AB 4-0 PS2 18 (SUTURE) ×3 IMPLANT
SUT VIC AB 3-0 SH 27 (SUTURE) ×2
SUT VIC AB 3-0 SH 27X BRD (SUTURE) ×1 IMPLANT
SYR BULB 3OZ (MISCELLANEOUS) ×3 IMPLANT
SYR CONTROL 10ML LL (SYRINGE) ×3 IMPLANT
TOWEL OR 17X24 6PK STRL BLUE (TOWEL DISPOSABLE) ×3 IMPLANT
TOWEL OR 17X26 10 PK STRL BLUE (TOWEL DISPOSABLE) ×3 IMPLANT
TUBE CONNECTING 12'X1/4 (SUCTIONS)
TUBE CONNECTING 12X1/4 (SUCTIONS) IMPLANT
YANKAUER SUCT BULB TIP NO VENT (SUCTIONS) IMPLANT

## 2016-10-05 NOTE — Anesthesia Preprocedure Evaluation (Signed)
Anesthesia Evaluation  Patient identified by MRN, date of birth, ID band Patient awake    Reviewed: Allergy & Precautions, NPO status , Patient's Chart, lab work & pertinent test results  History of Anesthesia Complications Negative for: history of anesthetic complications  Airway Mallampati: II  TM Distance: >3 FB Neck ROM: Full    Dental  (+) Teeth Intact,    Pulmonary neg shortness of breath, neg sleep apnea, neg COPD, neg recent URI, former smoker,    breath sounds clear to auscultation       Cardiovascular hypertension, Pt. on medications (-) angina(-) Past MI and (-) CHF  Rhythm:Regular     Neuro/Psych PSYCHIATRIC DISORDERS Anxiety negative neurological ROS     GI/Hepatic negative GI ROS, Neg liver ROS,   Endo/Other  diabetes, Type 2, Oral Hypoglycemic AgentsHypothyroidism Morbid obesity  Renal/GU negative Renal ROS     Musculoskeletal   Abdominal   Peds  Hematology   Anesthesia Other Findings   Reproductive/Obstetrics                             Anesthesia Physical Anesthesia Plan  ASA: II  Anesthesia Plan: General   Post-op Pain Management:    Induction: Intravenous  PONV Risk Score and Plan: 3 and Dexamethasone, Propofol, Midazolam, Treatment may vary due to age and Ondansetron  Airway Management Planned: LMA  Additional Equipment: None  Intra-op Plan:   Post-operative Plan:   Informed Consent: I have reviewed the patients History and Physical, chart, labs and discussed the procedure including the risks, benefits and alternatives for the proposed anesthesia with the patient or authorized representative who has indicated his/her understanding and acceptance.   Dental advisory given  Plan Discussed with: CRNA and Surgeon  Anesthesia Plan Comments:         Anesthesia Quick Evaluation

## 2016-10-05 NOTE — Interval H&P Note (Signed)
History and Physical Interval Note:no change in H and P  10/05/2016 7:04 AM  Holly [name]  has presented today for surgery, with the diagnosis of LEFT BREAST DCIS WITH POSITIVE MARGINS  The various methods of treatment have been discussed with the patient and family. After consideration of risks, benefits and other options for treatment, the patient has consented to  Procedure(s): RE-EXCISION OF LEFT BREAST DCIS (Left) as a surgical intervention .  The patient's history has been reviewed, patient examined, no change in status, stable for surgery.  I have reviewed the patient's chart and labs.  Questions were answered to the patient's satisfaction.     Bettie Capistran A

## 2016-10-05 NOTE — Transfer of Care (Signed)
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

## 2016-10-05 NOTE — Op Note (Signed)
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 The final pathology showed ductal carcinoma in situ. Margins were negative however the anterior margin was very close. The decision was made to re-excise the anterior margin  Procedure: The patient was brought to the operating room and identified as the correct patient. She was placed supine on the operating room table and general anesthesia was induced. Her left breast was prepped and draped in the usual sterile fashion. She had a transverse incision along her inframammary ridge on the left breast. I anesthetized the area with Marcaine. I then opened up the old incision with a scalpel. I entered the seroma cavity which I aspirated. I was unable to easily excise the anterior margin with the electrocautery. The anterior margin was then sent to pathology for evaluation. I then achieved hemostasis with the cautery. I anesthetized the wound further with Marcaine. I then closed the subcutaneous tissue with interrupted 3-0 Vicryl sutures and closed the skin with a running 4-0 Monocryl suture. Dermabond was then applied. The patient tolerated the procedure well. All the counts were correct at the end of the procedure. The patient was then extubated in the operating room and taken in a stable condition to the recovery room.          Dmitri Pettigrew A   Date: 10/05/2016  Time: 8:05 AM

## 2016-10-05 NOTE — Anesthesia Procedure Notes (Signed)
Procedure Name: LMA Insertion Date/Time: 10/05/2016 7:34 AM Performed by: Izora Gala Pre-anesthesia Checklist: Patient identified, Emergency Drugs available, Suction available and Patient being monitored Patient Re-evaluated:Patient Re-evaluated prior to inductionOxygen Delivery Method: Circle system utilized Preoxygenation: Pre-oxygenation with 100% oxygen Intubation Type: IV induction Ventilation: Mask ventilation without difficulty LMA: LMA inserted LMA Size: 4.0 Number of attempts: 1 Placement Confirmation: positive ETCO2 Dental Injury: Teeth and Oropharynx as per pre-operative assessment

## 2016-10-05 NOTE — Discharge Instructions (Signed)
[facility] °Office Phone Number [phone] ° °BREAST BIOPSY/ PARTIAL MASTECTOMY: POST OP INSTRUCTIONS ° °Always review your discharge instruction sheet given to you by the facility where your surgery was performed. ° °IF YOU HAVE DISABILITY OR FAMILY LEAVE FORMS, YOU MUST BRING THEM TO THE OFFICE FOR PROCESSING.  DO NOT GIVE THEM TO YOUR DOCTOR. ° °1. A prescription for pain medication may be given to you upon discharge.  Take your pain medication as prescribed, if needed.  If narcotic pain medicine is not needed, then you may take acetaminophen (Tylenol) or ibuprofen (Advil) as needed. °2. Take your usually prescribed medications unless otherwise directed °3. If you need a refill on your pain medication, please contact your pharmacy.  They will contact our office to request authorization.  Prescriptions will not be filled after 5pm or on week-ends. °4. You should eat very light the first 24 hours after surgery, such as soup, crackers, pudding, etc.  Resume your normal diet the day after surgery. °5. Most patients will experience some swelling and bruising in the breast.  Ice packs and a good support bra will help.  Swelling and bruising can take several days to resolve.  °6. It is common to experience some constipation if taking pain medication after surgery.  Increasing fluid intake and taking a stool softener will usually help or prevent this problem from occurring.  A mild laxative (Milk of Magnesia or Miralax) should be taken according to package directions if there are no bowel movements after 48 hours. °7. Unless discharge instructions indicate otherwise, you may remove your bandages 24-48 hours after surgery, and you may shower at that time.  You may have steri-strips (small skin tapes) in place directly over the incision.  These strips should be left on the skin for 7-10 days.  If your surgeon used skin glue on the incision, you may shower in 24 hours.  The glue will flake off over the  next 2-3 weeks.  Any sutures or staples will be removed at the office during your follow-up visit. °8. ACTIVITIES:  You may resume regular daily activities (gradually increasing) beginning the next day.  Wearing a good support bra or sports bra minimizes pain and swelling.  You may have sexual intercourse when it is comfortable. °a. You may drive when you no longer are taking prescription pain medication, you can comfortably wear a seatbelt, and you can safely maneuver your car and apply brakes. °b. RETURN TO WORK:  ______________________________________________________________________________________ °9. You should see your doctor in the office for a follow-up appointment approximately two weeks after your surgery.  Your doctor’s nurse will typically make your follow-up appointment when she calls you with your pathology report.  Expect your pathology report 2-3 business days after your surgery.  You may call to check if you do not hear from us after three days. °10. OTHER INSTRUCTIONS: _______________________________________________________________________________________________ _____________________________________________________________________________________________________________________________________ °_____________________________________________________________________________________________________________________________________ °_____________________________________________________________________________________________________________________________________ ° °WHEN TO CALL YOUR DOCTOR: °1. Fever over 101.0 °2. Nausea and/or vomiting. °3. Extreme swelling or bruising. °4. Continued bleeding from incision. °5. Increased pain, redness, or drainage from the incision. ° °The clinic staff is available to answer your questions during regular business hours.  Please don’t hesitate to call and ask to speak to one of the nurses for clinical concerns.  If you have a medical emergency, go to the nearest  emergency room or call 911.  A surgeon from [facility] is always on call at the hospital. ° °For further questions, please visit [email or web address]  °

## 2016-10-06 ENCOUNTER — Encounter (HOSPITAL_COMMUNITY): Payer: Self-pay | Admitting: Surgery

## 2016-10-06 NOTE — Anesthesia Postprocedure Evaluation (Signed)
Anesthesia Post Note  Patient: Holly [name]  Procedure(s) Performed: Procedure(s) (LRB): RE-EXCISION OF LEFT BREAST DUCTAL CARCINOMA  IN SITU (Left)     Patient location during evaluation: PACU Anesthesia Type: General Level of consciousness: awake and alert Pain management: pain level controlled Vital Signs Assessment: post-procedure vital signs reviewed and stable Respiratory status: spontaneous breathing, nonlabored ventilation, respiratory function stable and patient connected to nasal cannula oxygen Cardiovascular status: blood pressure returned to baseline and stable Postop Assessment: no signs of nausea or vomiting Anesthetic complications: no    Last Vitals:  Vitals:   10/05/16 0838 10/05/16 0840  BP: 136/74 137/77  Pulse: 72 71  Resp: 15 16  Temp: 36.6 C     Last Pain:  Vitals:   10/05/16 0632  TempSrc: Oral                 Jaquavian Firkus

## 2016-10-06 NOTE — Anesthesia Postprocedure Evaluation (Signed)
Anesthesia Post Note  Patient: Holly [name]  Procedure(s) Performed: Procedure(s) (LRB): RE-EXCISION OF LEFT BREAST DUCTAL CARCINOMA  IN SITU (Left)     Patient location during evaluation: PACU Anesthesia Type: General Level of consciousness: awake and alert Pain management: pain level controlled Vital Signs Assessment: post-procedure vital signs reviewed and stable Respiratory status: spontaneous breathing, nonlabored ventilation, respiratory function stable and patient connected to nasal cannula oxygen Cardiovascular status: blood pressure returned to baseline and stable Postop Assessment: no signs of nausea or vomiting Anesthetic complications: no    Last Vitals:  Vitals:   10/05/16 0838 10/05/16 0840  BP: 136/74 137/77  Pulse: 72 71  Resp: 15 16  Temp: 36.6 C     Last Pain:  Vitals:   10/05/16 0632  TempSrc: Oral                 Letasha Kershaw

## 2018-01-07 IMAGING — MG NEEDLE LOCALIZATION OF THE LEFT BREAST WITH MAMMO GUIDANCE
8 of 9 series · 8 of 9 positions shown · non-contrast
Comparison: Previous exam(s).

CLINICAL DATA: Recent stereotactic biopsy of left breast
calcifications showed fibrocystic change with calcifications and a
fibroadenoma in the inferior left breast. Pathology was felt to be
discordant and surgical excision recommended.

EXAM:
MAMMOGRAPHIC GUIDED RADIOACTIVE SEED LOCALIZATION OF THE LEFT BREAST

[L CC (1 of 5)]
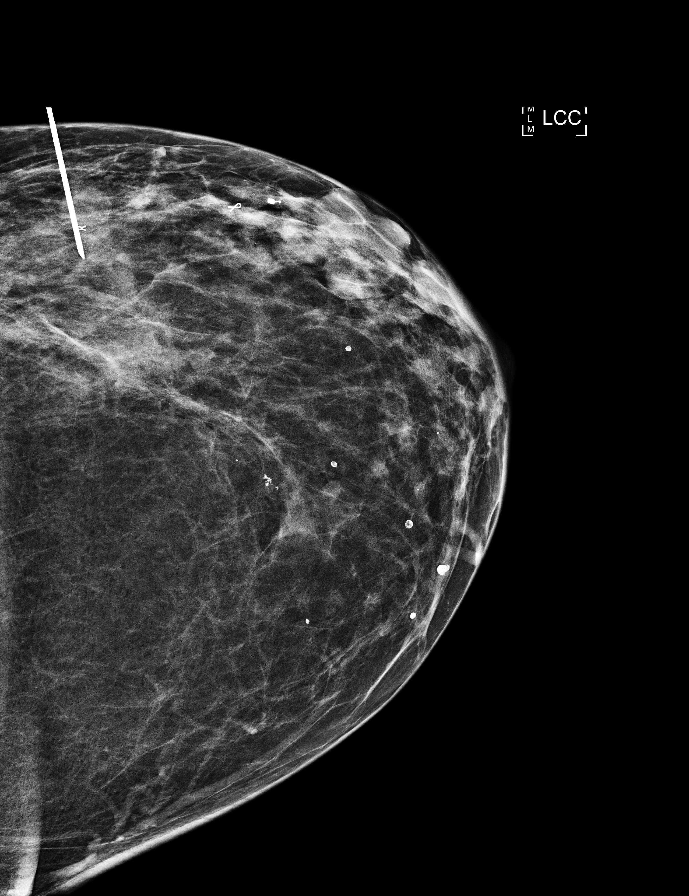

[L CC (2 of 5)]
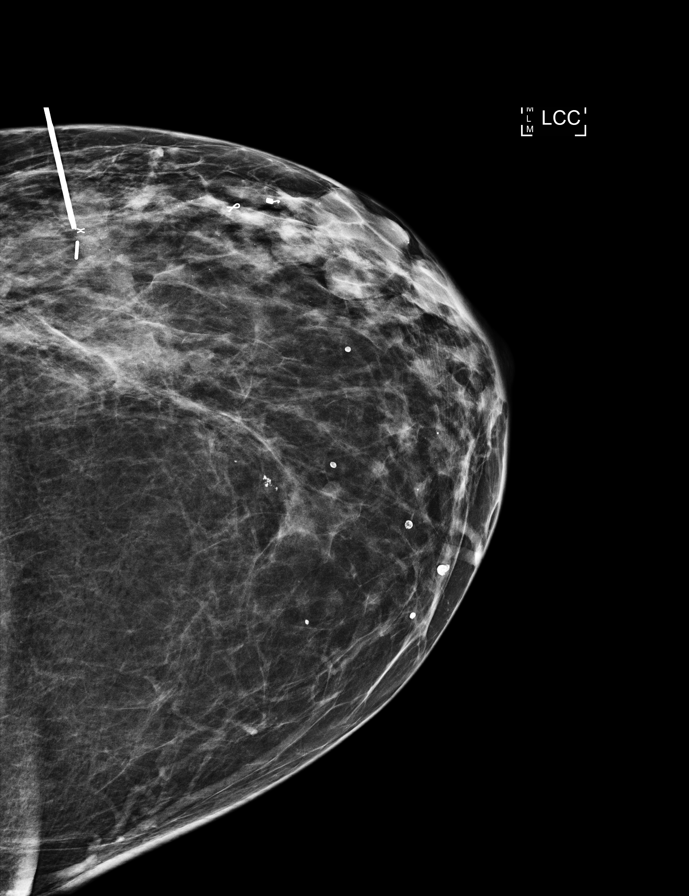

[L CC (3 of 5)]
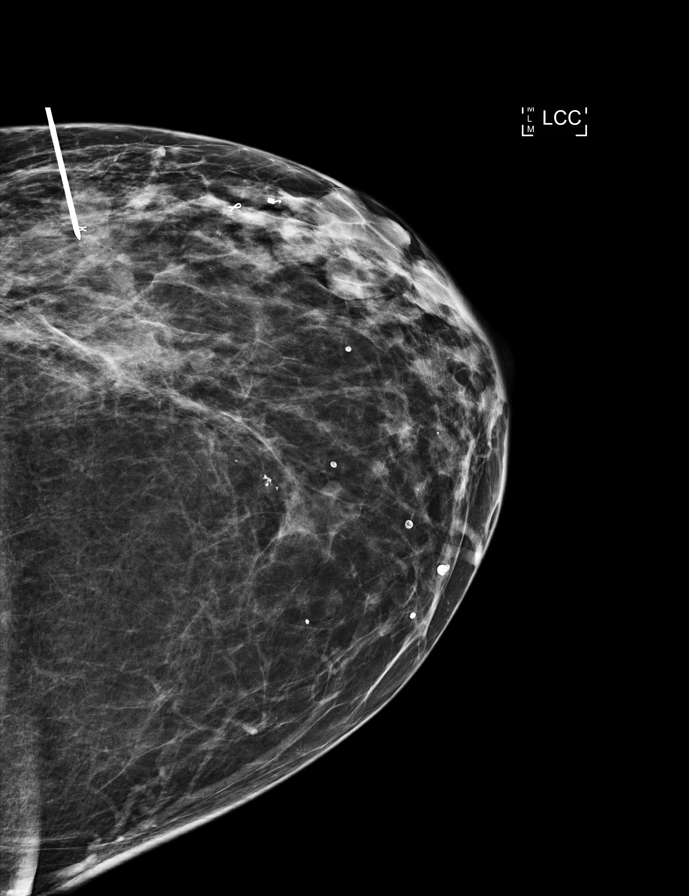

[L LM (1 of 3)]
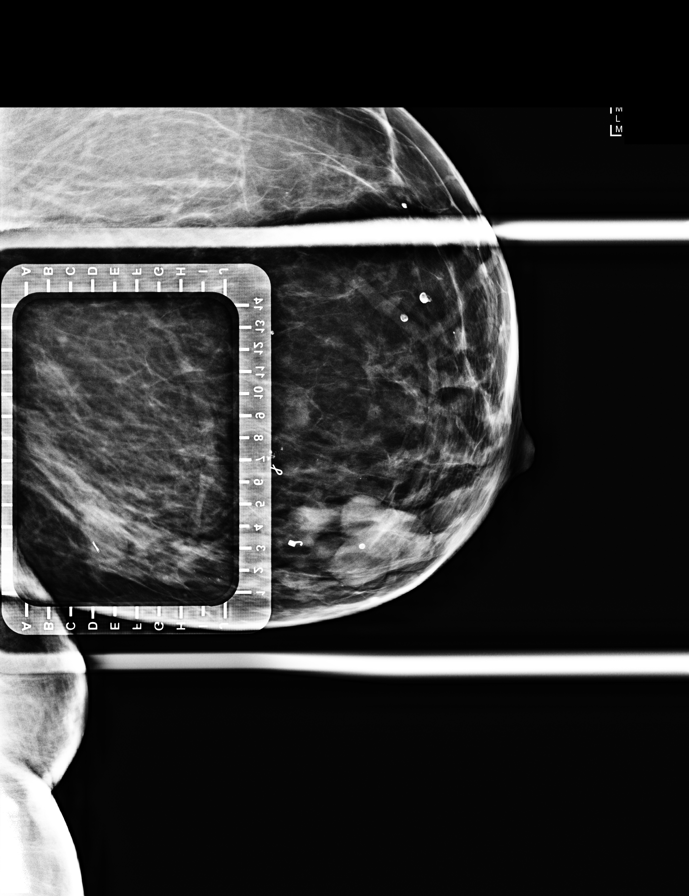

[L LM (2 of 3)]
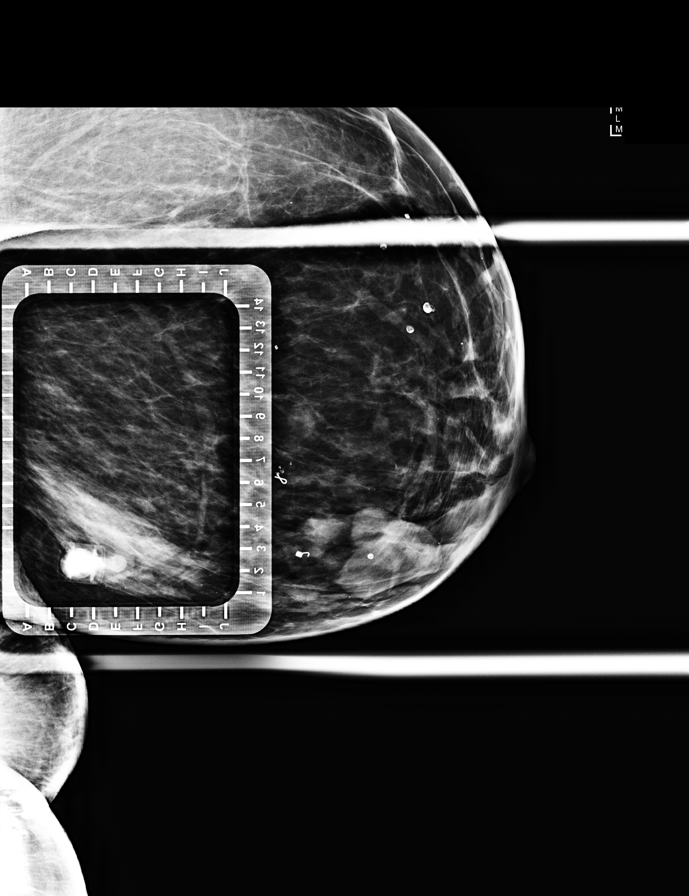

[L CC (4 of 5)]
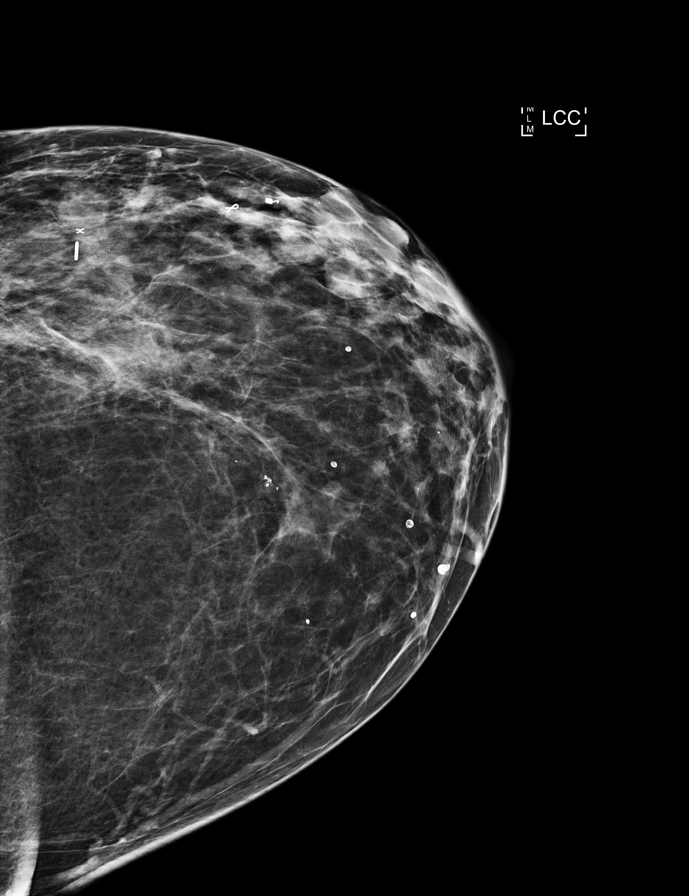

[L CC (5 of 5)]
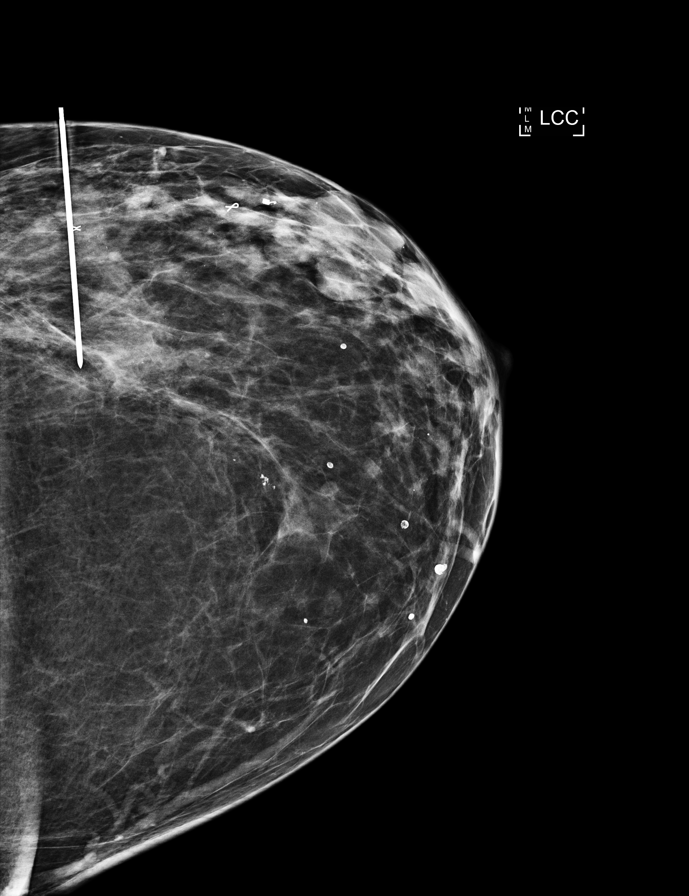

[L LM (3 of 3)]
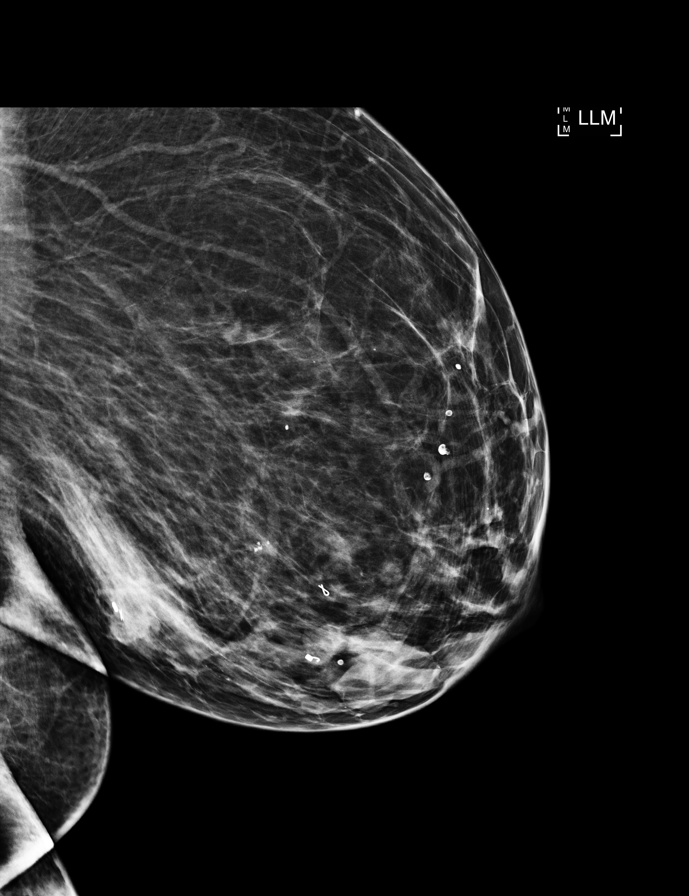

[8 of 9 positions shown; findings below may reference images not displayed]



The usual time-out protocol was performed immediately prior to the
procedure.

Using mammographic guidance, sterile technique, 1% lidocaine and an
O-8SE radioactive seed, the X shaped clip was localized using a
lateral to medial approach. The follow-up mammogram images confirm
the seed in the expected location and were marked for Dr. Nickelsen.

Follow-up survey of the patient confirms presence of the radioactive
seed.

Order number of [id number] seed:  [id number].

Total activity:  0.252 millicuries  Reference Date: 08/16/2016

The patient tolerated the procedure well and was released from the
[REDACTED]. She was given instructions regarding seed removal.
IMPRESSION: Radioactive seed localization left breast. No apparent
complications.

## 2018-01-08 IMAGING — MG BREAST SURGICAL SPECIMEN
1 series · 1 of 1 positions shown · non-contrast
Comparison: Previous exam(s).

CLINICAL DATA: 65-year-old female status post left excisional
biopsy

EXAM:
SPECIMEN RADIOGRAPH OF THE LEFT BREAST

[L]
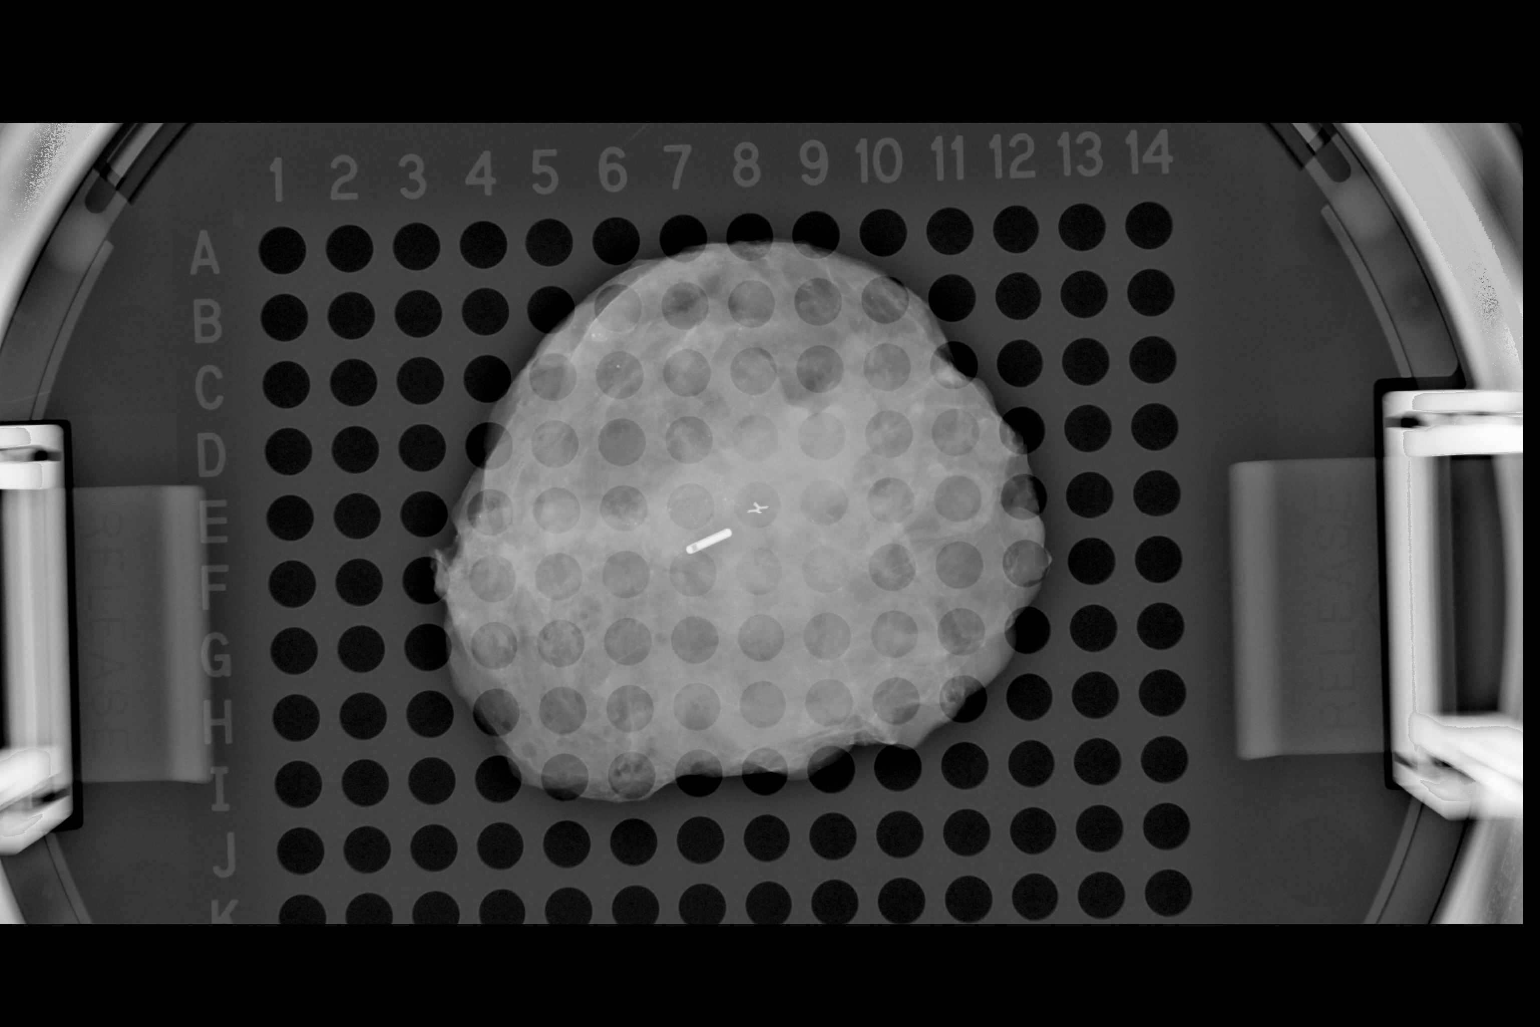

[1 of 1 positions shown; findings below may reference images not displayed]

FINDINGS: Status post excision of the left breast. The radioactive seed and
biopsy marker clip are present, completely intact, and were marked
for pathology. Findings were communicated to the operating room via
telephone at the time of image interpretation.
IMPRESSION: Specimen radiograph of the left breast.

## 2021-06-03 DEATH — deceased
